# Patient Record
Sex: Male | Born: 1990 | Race: White | Hispanic: No | Marital: Single | Smoking: Current every day smoker
Health system: Southern US, Community
[De-identification: ages and names within clinical notes are randomized; demographics above are authoritative.]

## PROBLEM LIST (undated history)

## (undated) HISTORY — PX: SHOULDER SURGERY: SHX246

---
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
## 2012-05-07 ENCOUNTER — Encounter (HOSPITAL_COMMUNITY): Payer: Self-pay

## 2012-05-07 ENCOUNTER — Emergency Department (HOSPITAL_COMMUNITY)
Admission: EM | Admit: 2012-05-07 | Discharge: 2012-05-07 | Disposition: A | Discharge status: Home / Self Care | Payer: Self-pay | Attending: Emergency Medicine | Admitting: Emergency Medicine

## 2012-05-07 DIAGNOSIS — F410 Panic disorder [episodic paroxysmal anxiety] without agoraphobia: Secondary | ICD-10-CM

## 2012-05-07 DIAGNOSIS — F17200 Nicotine dependence, unspecified, uncomplicated: Secondary | ICD-10-CM | POA: Insufficient documentation

## 2012-05-07 DIAGNOSIS — R0602 Shortness of breath: Secondary | ICD-10-CM | POA: Insufficient documentation

## 2012-05-07 DIAGNOSIS — F1010 Alcohol abuse, uncomplicated: Secondary | ICD-10-CM

## 2012-05-07 NOTE — Discharge Instructions (Signed)
The most likely cause of your shortness of breath is anxiety. Your oxygen level here, even while walking is 100%. We cannot improve upon that. If you continue to have shortness of breath, develop a cough, fever, vomiting, return to the ER for evaluation.

## 2012-05-07 NOTE — ED Notes (Signed)
Pt walked around department several times, o2 sats remained between 99-100%, MD notified.

## 2012-05-07 NOTE — ED Notes (Signed)
Pt via EMS after being found sitting in vehicle by police. Pt feels good at present. No signs of distress. Pt states he drank 12 beers

## 2012-05-09 NOTE — ED Provider Notes (Signed)
History     CSN: [id number]  Arrival date & time 05/07/12  0608   First MD Initiated Contact with Patient 05/07/12 [phone]      Chief Complaint  Patient presents with  . Alcohol Problem    (Consider location/radiation/quality/duration/timing/severity/associated sxs/prior treatment) HPI James Mcmahon is a 21 y.o. male brought in by ambulance, who presents to the Emergency Department having been found by police sitting in his car. He had a fight with his girlfriend and drank 12 beers. He did not feel he was safe to drive home so he pulled over on the side of the road. He denies SI/HI. He reports he was feeling anxious and felt short of breath. Currently he has no c/o.    History reviewed. No pertinent past medical history.  History reviewed. No pertinent past surgical history.  No family history on file.  History  Substance Use Topics  . Smoking status: Current Some Day Smoker  . Smokeless tobacco: Not on file  . Alcohol Use: 7.2 oz/week    12 Cans of beer per week      Review of Systems  Constitutional: Negative for fever.       10 Systems reviewed and are negative for acute change except as noted in the HPI.  HENT: Negative for congestion.   Eyes: Negative for discharge and redness.  Respiratory: Negative for cough and shortness of breath.   Cardiovascular: Negative for chest pain.  Gastrointestinal: Negative for vomiting and abdominal pain.  Musculoskeletal: Negative for back pain.  Skin: Negative for rash.  Neurological: Negative for syncope, numbness and headaches.  Psychiatric/Behavioral:       No behavior change.    Allergies  Review of patient's allergies indicates no known allergies.  Home Medications  No current outpatient prescriptions on file.  BP 126/80  Pulse 85  Temp(Src) 98.7 F (37.1 C) (Oral)  Resp 20  Ht 5\' 6"  (1.676 m)  Wt 140 lb (63.504 kg)  BMI 22.60 kg/m2  SpO2 99%  Physical Exam  Nursing note and vitals  reviewed. Constitutional:       Awake, alert, nontoxic appearance.  HENT:  Head: Atraumatic.  Eyes: Right eye exhibits no discharge. Left eye exhibits no discharge.  Neck: Neck supple.  Cardiovascular: Normal rate, regular rhythm and normal heart sounds.   Pulmonary/Chest: Effort normal. He exhibits no tenderness.  Abdominal: Soft. There is no tenderness. There is no rebound.  Musculoskeletal: He exhibits no tenderness.       Baseline ROM, no obvious new focal weakness.  Neurological:       Mental status and motor strength appears baseline for patient and situation.  Skin: No rash noted.  Psychiatric: He has a normal mood and affect.    ED Course  Procedures (including critical care time)    1. Shortness of breath       MDM  Patient brought in by ambulance after drinking 12 beers. He has remained alert and oriented, ambulated in the hall without assistance. He has a family member to drive him home.Pt stable in ED with no significant deterioration in condition.The patient appears reasonably screened and/or stabilized for discharge and I doubt any other medical condition or other [facility] requiring further screening, evaluation, or treatment in the ED at this time prior to discharge.  MDM Reviewed: nursing note and vitals           Nicoletta Dress. Colon Branch, MD 05/09/12 2157

## 2014-08-25 ENCOUNTER — Telehealth: Payer: Self-pay | Admitting: Family Medicine

## 2014-08-25 ENCOUNTER — Ambulatory Visit (INDEPENDENT_AMBULATORY_CARE_PROVIDER_SITE_OTHER): Payer: Managed Care, Other (non HMO) | Admitting: Family Medicine

## 2014-08-25 ENCOUNTER — Encounter: Payer: Self-pay | Admitting: Family Medicine

## 2014-08-25 VITALS — BP 140/86 | HR 103 | Temp 99.4°F | Ht 66.0 in | Wt 160.0 lb

## 2014-08-25 DIAGNOSIS — J019 Acute sinusitis, unspecified: Secondary | ICD-10-CM | POA: Diagnosis not present

## 2014-08-25 MED ORDER — AMOXICILLIN 500 MG PO CAPS: 500.0000 mg | ORAL_CAPSULE | Freq: Three times a day (TID) | ORAL | Status: DC

## 2014-08-25 MED ORDER — HYDROCODONE-HOMATROPINE 5-1.5 MG/5ML PO SYRP
5.0000 mL | ORAL_SOLUTION | Freq: Three times a day (TID) | ORAL | Status: DC | PRN
Start: 2014-08-25 — End: 2014-12-24

## 2014-08-25 NOTE — Progress Notes (Signed)
Patient ID: James Mcmahon, male   DOB: Jul 14, 1991, 23 y.o.   MRN: [id number] S: 23 year old gentleman with a two-day history of sore throat cough and sinus pain and pressure. He is not sure whether or not he has had fever. The cough is described as occasionally productive. He says that he was exposed to mono to his girlfriend's younger sister but since he has only had sore throat for 2 days I would not check him for that.  O: Throat mildly injected without exudate; there is no significant adenopathy although he says his neck is tender. Sinus tenderness in the frontal area Ears TMs are clear with normal landmarks Chest lungs are clear to percussion and auscultation I suspect this is related to sinus infection and drainage since he has the cough. I doubt mono and have discussed this with him as well  AP: Probable sinus infection plan Will treat with amoxicillin 500 3 times a day and Hycodan cough syrup 1 or 2 teaspoons at bedtime. If symptoms persist consider checking for mono  Frederica Kuster MD

## 2014-08-25 NOTE — Patient Instructions (Signed)

## 2014-08-27 NOTE — Telephone Encounter (Signed)
Attempt to contact patient was unsuccessful.  

## 2014-12-24 ENCOUNTER — Ambulatory Visit (INDEPENDENT_AMBULATORY_CARE_PROVIDER_SITE_OTHER): Payer: BLUE CROSS/BLUE SHIELD | Admitting: Family Medicine

## 2014-12-24 ENCOUNTER — Encounter: Payer: Self-pay | Admitting: Family Medicine

## 2014-12-24 VITALS — BP 123/81 | HR 65 | Temp 97.9°F | Ht 66.0 in | Wt 164.8 lb

## 2014-12-24 DIAGNOSIS — J206 Acute bronchitis due to rhinovirus: Secondary | ICD-10-CM

## 2014-12-24 MED ORDER — AMOXICILLIN 500 MG PO CAPS: 500.0000 mg | ORAL_CAPSULE | Freq: Three times a day (TID) | ORAL | Status: AC

## 2014-12-24 MED ORDER — HYDROCODONE-HOMATROPINE 5-1.5 MG/5ML PO SYRP: 5.0000 mL | ORAL_SOLUTION | Freq: Three times a day (TID) | ORAL | Status: AC | PRN

## 2014-12-24 MED ORDER — PREDNISONE 20 MG PO TABS: 20.0000 mg | ORAL_TABLET | Freq: Every day | ORAL | Status: AC

## 2014-12-24 NOTE — Progress Notes (Signed)
   Subjective:    Patient ID: Newman NipRobert Christley, male    DOB: 03/15/1991, 24 y.o.   MRN: [id number]  HPI C/o congestion and uri sx's.  He is a smoker.  He has severe cough at night and has difficulty sleeping.  Review of Systems  Constitutional: Negative for fever.  HENT: Negative for ear pain.   Eyes: Negative for discharge.  Respiratory: Negative for cough.   Cardiovascular: Negative for chest pain.  Gastrointestinal: Negative for abdominal distention.  Endocrine: Negative for polyuria.  Genitourinary: Negative for difficulty urinating.  Musculoskeletal: Negative for gait problem and neck pain.  Skin: Negative for color change and rash.  Neurological: Negative for speech difficulty and headaches.  Psychiatric/Behavioral: Negative for agitation.       Objective:    BP 123/81 mmHg  Pulse 65  Temp(Src) 97.9 F (36.6 C) (Oral)  Ht 5\' 6"  (1.676 m)  Wt 164 lb 12.8 oz (74.753 kg)  BMI 26.61 kg/m2 Physical Exam  Constitutional: He is oriented to person, place, and time. He appears well-developed and well-nourished.  HENT:  Head: Normocephalic and atraumatic.  Mouth/Throat: Oropharynx is clear and moist.  Eyes: Pupils are equal, round, and reactive to light.  Neck: Normal range of motion. Neck supple.  Cardiovascular: Normal rate and regular rhythm.   No murmur heard. Pulmonary/Chest: Effort normal and breath sounds normal.  Abdominal: Soft. Bowel sounds are normal. There is no tenderness.  Neurological: He is alert and oriented to person, place, and time.  Skin: Skin is warm and dry.  Psychiatric: He has a normal mood and affect.          Assessment & Plan:     ICD-9-CM ICD-10-CM   1. Acute bronchitis due to Rhinovirus 466.0 J20.6    079.3     Hycodan cough syrup one tsp po q 12 hours prn #120 ml Zpak as directed Prednisone 20mg  one po qd x 4 days #4  Work note Push po fluids, rest, tylenol and motrin otc prn as directed for fever, arthralgias, and myalgias.   Follow up prn if sx's continue or persist. No Follow-up on file.  Deatra CanterWilliam J Shalea Tomczak FNP

## 2015-01-05 ENCOUNTER — Encounter: Payer: Self-pay | Admitting: *Deleted

## 2015-03-30 ENCOUNTER — Encounter (HOSPITAL_COMMUNITY): Payer: Self-pay | Admitting: *Deleted

## 2015-03-30 ENCOUNTER — Emergency Department (HOSPITAL_COMMUNITY)
Admission: EM | Admit: 2015-03-30 | Discharge: 2015-03-30 | Disposition: A | Discharge status: Home / Self Care | Payer: BLUE CROSS/BLUE SHIELD | Attending: Emergency Medicine | Admitting: Emergency Medicine

## 2015-03-30 ENCOUNTER — Emergency Department (HOSPITAL_COMMUNITY): Payer: BLUE CROSS/BLUE SHIELD

## 2015-03-30 DIAGNOSIS — Y998 Other external cause status: Secondary | ICD-10-CM | POA: Diagnosis not present

## 2015-03-30 DIAGNOSIS — S46912A Strain of unspecified muscle, fascia and tendon at shoulder and upper arm level, left arm, initial encounter: Secondary | ICD-10-CM | POA: Insufficient documentation

## 2015-03-30 DIAGNOSIS — Y92814 Boat as the place of occurrence of the external cause: Secondary | ICD-10-CM | POA: Diagnosis not present

## 2015-03-30 DIAGNOSIS — Z7952 Long term (current) use of systemic steroids: Secondary | ICD-10-CM | POA: Insufficient documentation

## 2015-03-30 DIAGNOSIS — W231XXA Caught, crushed, jammed, or pinched between stationary objects, initial encounter: Secondary | ICD-10-CM | POA: Insufficient documentation

## 2015-03-30 DIAGNOSIS — S4992XA Unspecified injury of left shoulder and upper arm, initial encounter: Secondary | ICD-10-CM | POA: Diagnosis present

## 2015-03-30 DIAGNOSIS — Z720 Tobacco use: Secondary | ICD-10-CM | POA: Diagnosis not present

## 2015-03-30 DIAGNOSIS — Z792 Long term (current) use of antibiotics: Secondary | ICD-10-CM | POA: Insufficient documentation

## 2015-03-30 DIAGNOSIS — Y9389 Activity, other specified: Secondary | ICD-10-CM | POA: Diagnosis not present

## 2015-03-30 MED ORDER — NAPROXEN 500 MG PO TABS
500.0000 mg | ORAL_TABLET | Freq: Once | ORAL | Status: AC
  Administered 2015-03-30: 500 mg via ORAL
  Filled 2015-03-30: qty 1

## 2015-03-30 MED ORDER — NAPROXEN 500 MG PO TABS: 500.0000 mg | ORAL_TABLET | Freq: Two times a day (BID) | ORAL | Status: AC

## 2015-03-30 MED ORDER — HYDROCODONE-ACETAMINOPHEN 5-325 MG PO TABS
1.0000 | ORAL_TABLET | Freq: Once | ORAL | Status: AC
  Administered 2015-03-30: 1 via ORAL
  Filled 2015-03-30: qty 1

## 2015-03-30 NOTE — ED Provider Notes (Signed)
CSN: [id number]     Arrival date & time 03/30/15  0941 History   First MD Initiated Contact with Patient 03/30/15 1010     Chief Complaint  Patient presents with  . Shoulder Pain     (Consider location/radiation/quality/duration/timing/severity/associated sxs/prior Treatment) Patient is a 24 y.o. male presenting with shoulder pain. The history is provided by the patient. No language interpreter was used.  Shoulder Pain Location:  Shoulder Time since incident:  1 hour Injury: yes   Mechanism of injury: amputation   Mechanism of injury comment:  Cut arm caught in a bag strap Pain details:    Quality:  Aching   Radiates to:  Does not radiate   Severity:  Moderate   Onset quality:  Sudden   Duration:  1 hour   Timing:  Constant   Progression:  Unchanged Chronicity:  New Dislocation: no   Foreign body present:  No foreign bodies Tetanus status:  Up to date Prior injury to area:  Yes Relieved by:  Immobilization Worsened by:  Movement and stretching area Ineffective treatments:  None tried Associated symptoms: numbness   Associated symptoms: no back pain, no fatigue, no fever, no neck pain, no stiffness, no swelling and no tingling   Associated symptoms comment:  Paresthesias of fingertips of left hand Risk factors: concern for non-accidental trauma   Risk factors: no known bone disorder     No past medical history on file. No past surgical history on file. Family History  Problem Relation Age of Onset  . ADD / ADHD Brother   . ADD / ADHD Brother    History  Substance Use Topics  . Smoking status: Current Every Day Smoker -- 0.50 packs/day for 7 years  . Smokeless tobacco: Never Used  . Alcohol Use: 7.2 oz/week    12 Cans of beer per week     Comment: weekly    Review of Systems  Constitutional: Negative for fever, activity change, appetite change and fatigue.  HENT: Negative for congestion, facial swelling, rhinorrhea and trouble swallowing.   Eyes: Negative for  photophobia and pain.  Respiratory: Negative for cough, chest tightness and shortness of breath.   Cardiovascular: Negative for chest pain and leg swelling.  Gastrointestinal: Negative for nausea, vomiting, abdominal pain, diarrhea and constipation.  Endocrine: Negative for polydipsia and polyuria.  Genitourinary: Negative for dysuria, urgency, decreased urine volume and difficulty urinating.  Musculoskeletal: Negative for back pain, gait problem, stiffness and neck pain.  Skin: Negative for color change, rash and wound.  Allergic/Immunologic: Negative for immunocompromised state.  Neurological: Negative for dizziness, facial asymmetry, speech difficulty, weakness, numbness and headaches.  Psychiatric/Behavioral: Negative for confusion, decreased concentration and agitation.      Allergies  Review of patient's allergies indicates no known allergies.  Home Medications   Prior to Admission medications   Medication Sig Start Date End Date Taking? Authorizing Provider  ibuprofen (ADVIL,MOTRIN) 200 MG tablet Take 400 mg by mouth every 4 (four) hours as needed for fever, headache, moderate pain or cramping.   Yes Historical Provider, MD  amoxicillin (AMOXIL) 500 MG capsule Take 1 capsule (500 mg total) by mouth 3 (three) times daily. Patient not taking: Reported on 03/30/2015 12/24/14   Deatra Canter, FNP  HYDROcodone-homatropine [facility]) 5-1.5 MG/5ML syrup Take 5 mLs by mouth every 8 (eight) hours as needed for cough. Patient not taking: Reported on 03/30/2015 12/24/14   Deatra Canter, FNP  naproxen (NAPROSYN) 500 MG tablet Take 1 tablet (500 mg total)  by mouth 2 (two) times daily with a meal. 03/30/15   Toy CookeyMegan Aava Deland, MD  predniSONE (DELTASONE) 20 MG tablet Take 1 tablet (20 mg total) by mouth daily with breakfast. Patient not taking: Reported on 03/30/2015 12/24/14   Anselm PancoastWilliam J Oxford, FNP   BP 144/83 mmHg  Pulse 72  Resp 17  Ht 5\' 7"  (1.702 m)  Wt 165 lb (74.844 kg)  BMI 25.84 kg/m2   SpO2 100% Physical Exam  Constitutional: He is oriented to person, place, and time. He appears well-developed and well-nourished. No distress.  HENT:  Head: Normocephalic and atraumatic.  Mouth/Throat: No oropharyngeal exudate.  Eyes: Pupils are equal, round, and reactive to light.  Neck: Normal range of motion. Neck supple.  Cardiovascular: Normal rate, regular rhythm and normal heart sounds.  Exam reveals no gallop and no friction rub.   No murmur heard. Pulmonary/Chest: Effort normal and breath sounds normal. No respiratory distress. He has no wheezes. He has no rales.  Abdominal: Soft. Bowel sounds are normal. He exhibits no distension and no mass. There is no tenderness. There is no rebound and no guarding.  Musculoskeletal: He exhibits no edema.       Left shoulder: He exhibits decreased range of motion, tenderness and pain. He exhibits no swelling, no effusion, no crepitus, no deformity, no laceration, normal pulse and normal strength.       Arms: Neurological: He is alert and oriented to person, place, and time.  Skin: Skin is warm and dry.  Psychiatric: He has a normal mood and affect.    ED Course  Procedures (including critical care time) Labs Review Labs Reviewed - No data to display  Imaging Review Dg Shoulder Left  03/30/2015   CLINICAL DATA:  Injured LEFT shoulder today when bulk bag got caught on boat jerking shoulder, shoulder pain, prior LEFT shoulder dislocation 3 times  EXAM: LEFT SHOULDER - 2+ VIEW  COMPARISON:  None  FINDINGS: AC joint alignment normal.  Osseous mineralization normal.  No acute fracture, dislocation or bone destruction.  Visualized LEFT ribs intact.  IMPRESSION: Normal exam.   Electronically Signed   By: Ulyses SouthwardMark  Boles M.D.   On: 03/30/2015 10:40     EKG Interpretation None      MDM   Final diagnoses:  Left shoulder strain, initial encounter   Pt is a [age] y.o. male with Pmhx as above who presents with sudden onset left anterior shoulder  pain after getting his arm caught in a fishing bag.  He reports having some paresthesias in her fingertips of left hand, otherwise has no neuro complaints, motor and pulses are normal.  Shoulder appears located.  X-ray negative.  Patient will be placed in a sling for comfort, plan is for scheduled NSAIDs, ice and heat for pain.  I've asked him to follow-up with orthopedics in one week if still having symptoms to work on range of motion at home to avoid stiff shoulder.       Toy CookeyMegan Auguste Tebbetts, MD 03/30/15 [phone]

## 2015-03-30 NOTE — ED Notes (Signed)
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 Today pt was getting off his boat with book bag on, when left strap of book bag got caught and pt got jerked and got hung on boat by left strap of book bag. Pt reports now having left sided shoulder pain 7/10. Slight numbness in left finger tips. Radial pulse strong. Pt to intense to straighten arm out, limited ROM. Pt also while in [employer] had been told he had left rotator cuff damage.

## 2015-03-30 NOTE — Discharge Instructions (Signed)
Shoulder Pain The shoulder is the joint that connects your arms to your body. The bones that form the shoulder joint include the upper arm bone (humerus), the shoulder blade (scapula), and the collarbone (clavicle). The top of the humerus is shaped like a ball and fits into a rather flat socket on the scapula (glenoid cavity). A combination of muscles and strong, fibrous tissues that connect muscles to bones (tendons) support your shoulder joint and hold the ball in the socket. Small, fluid-filled sacs (bursae) are located in different areas of the joint. They act as cushions between the bones and the overlying soft tissues and help reduce friction between the gliding tendons and the bone as you move your arm. Your shoulder joint allows a wide range of motion in your arm. This range of motion allows you to do things like scratch your back or throw a ball. However, this range of motion also makes your shoulder more prone to pain from overuse and injury. Causes of shoulder pain can originate from both injury and overuse and usually can be grouped in the following four categories:  Redness, swelling, and pain (inflammation) of the tendon (tendinitis) or the bursae (bursitis).  Instability, such as a dislocation of the joint.  Inflammation of the joint (arthritis).  Broken bone (fracture). HOME CARE INSTRUCTIONS   Apply ice to the sore area.  Put ice in a plastic bag.  Place a towel between your skin and the bag.  Leave the ice on for 15-20 minutes, 3-4 times per day for the first 2 days, or as directed by your health care provider.  Stop using cold packs if they do not help with the pain.  If you have a shoulder sling or immobilizer, wear it as long as your caregiver instructs. Only remove it to shower or bathe. Move your arm as little as possible, but keep your hand moving to prevent swelling.  Squeeze a soft ball or foam pad as much as possible to help prevent swelling.  Only take  over-the-counter or prescription medicines for pain, discomfort, or fever as directed by your caregiver. SEEK MEDICAL CARE IF:   Your shoulder pain increases, or new pain develops in your arm, hand, or fingers.  Your hand or fingers become cold and numb.  Your pain is not relieved with medicines. SEEK IMMEDIATE MEDICAL CARE IF:   Your arm, hand, or fingers are numb or tingling.  Your arm, hand, or fingers are significantly swollen or turn white or blue. MAKE SURE YOU:   Understand these instructions.  Will watch your condition.  Will get help right away if you are not doing well or get worse. Document Released: 09/06/2005 Document Revised: 04/13/2014 Document Reviewed: 11/11/2011 [facility] Patient Information 2015 [place], [place]. This information is not intended to replace advice given to you by your health care provider. Make sure you discuss any questions you have with your health care provider.   Arm Sling Use A sling is used to:  Limit how much your arm moves.  Make you more comfortable.  Support your arm. The sling fits well if:  Your elbow rests in the bottom and corner pocket.  Only your fingers show at the opening. Your wrist should fit inside and be supported by the sling.  The strap goes around your shoulder or neck for support.  Your arm is fairly level with your hand, slightly higher than your elbow. HOME CARE   Adjust the sling to keep the hand inside. Slings tend to  slip, making the elbow point up. Tug the elbow back into place.  The fingers should feel warm and be a normal color.  Try to keep the palm of the hand toward the body while wearing the sling.  Take the sling off when going to sleep if this is okay with your doctor.  Use an extra pillow at night to protect the arm. Slide the arm between a pillow and the cover.  Take baths or showers as told by your doctor.  Only take medicine as told by your doctor. GET HELP RIGHT AWAY IF:   The  fingers turn cold or start to tingle.  The arm pain gets worse.  The pain is not helped by medicine or by adjusting the sling. MAKE SURE YOU:   Understand these instructions.  Will watch this condition.  Will get help right away if you are not doing well or get worse. Document Released: 05/15/2008 Document Revised: 02/19/2012 Document Reviewed: 05/15/2008 [facility] Patient Information 2015 [place]. This information is not intended to replace advice given to you by your health care provider. Make sure you discuss any questions you have with your health care provider.

## 2015-11-11 ENCOUNTER — Encounter (HOSPITAL_BASED_OUTPATIENT_CLINIC_OR_DEPARTMENT_OTHER): Payer: Self-pay | Admitting: *Deleted

## 2015-11-11 ENCOUNTER — Emergency Department (HOSPITAL_BASED_OUTPATIENT_CLINIC_OR_DEPARTMENT_OTHER)
Admission: EM | Admit: 2015-11-11 | Discharge: 2015-11-11 | Disposition: A | Discharge status: Home / Self Care | Payer: BLUE CROSS/BLUE SHIELD | Attending: Emergency Medicine | Admitting: Emergency Medicine

## 2015-11-11 DIAGNOSIS — Z791 Long term (current) use of non-steroidal anti-inflammatories (NSAID): Secondary | ICD-10-CM | POA: Insufficient documentation

## 2015-11-11 DIAGNOSIS — K029 Dental caries, unspecified: Secondary | ICD-10-CM | POA: Insufficient documentation

## 2015-11-11 MED ORDER — BUPIVACAINE-EPINEPHRINE (PF) 0.5% -1:200000 IJ SOLN
INTRAMUSCULAR | Status: AC
  Filled 2015-11-11: qty 1.8

## 2015-11-11 MED ORDER — HYDROCODONE-ACETAMINOPHEN 5-325 MG PO TABS: 1.0000 | ORAL_TABLET | Freq: Four times a day (QID) | ORAL | Status: AC | PRN

## 2015-11-11 MED ORDER — PENICILLIN V POTASSIUM 250 MG PO TABS: 250.0000 mg | ORAL_TABLET | Freq: Four times a day (QID) | ORAL | Status: AC

## 2015-11-11 NOTE — ED Notes (Signed)
States he had a tooth break yesterday. He cannot get a dentist appointment until Monday.

## 2015-11-11 NOTE — ED Provider Notes (Addendum)
CSN: [id number]     Arrival date & time 11/11/15  1435 History   First MD Initiated Contact with Patient 11/11/15 1445     Chief Complaint  Patient presents with  . Dental Pain     (Consider location/radiation/quality/duration/timing/severity/associated sxs/prior Treatment) Patient is a 24 y.o. male presenting with tooth pain. The history is provided by the patient.  Dental Pain Location:  Upper Upper teeth location:  5/RU 1st bicuspid Quality:  Localized, pulsating, radiating and sharp Severity:  Severe Onset quality:  Gradual Duration:  2 days Timing:  Constant Progression:  Worsening Chronicity:  New Context: dental caries   Context comment:  Tooth broke off yesterday while eating steak and has been hurting since Relieved by:  Nothing Worsened by:  Cold food/drink and hot food/drink Ineffective treatments:  None tried Associated symptoms: no drooling, no facial pain, no facial swelling, no oral bleeding and no trismus   Risk factors: lack of dental care, periodontal disease and smoking     History reviewed. No pertinent past medical history. Past Surgical History  Procedure Laterality Date  . Shoulder surgery     Family History  Problem Relation Age of Onset  . ADD / ADHD Brother   . ADD / ADHD Brother    Social History  Substance Use Topics  . Smoking status: Current Every Day Smoker -- 0.50 packs/day for 7 years  . Smokeless tobacco: Never Used  . Alcohol Use: 7.2 oz/week    12 Cans of beer per week     Comment: weekly    Review of Systems  HENT: Negative for drooling and facial swelling.   All other systems reviewed and are negative.     Allergies  Review of patient's allergies indicates no known allergies.  Home Medications   Prior to Admission medications   Medication Sig Start Date End Date Taking? Authorizing Provider  amoxicillin (AMOXIL) 500 MG capsule Take 1 capsule (500 mg total) by mouth 3 (three) times daily. Patient not taking: Reported  on 03/30/2015 12/24/14   Deatra CanterWilliam J Oxford, FNP  HYDROcodone-homatropine [facility](HYCODAN) 5-1.5 MG/5ML syrup Take 5 mLs by mouth every 8 (eight) hours as needed for cough. Patient not taking: Reported on 03/30/2015 12/24/14   Deatra CanterWilliam J Oxford, FNP  ibuprofen (ADVIL,MOTRIN) 200 MG tablet Take 400 mg by mouth every 4 (four) hours as needed for fever, headache, moderate pain or cramping.    Historical Provider, MD  naproxen (NAPROSYN) 500 MG tablet Take 1 tablet (500 mg total) by mouth 2 (two) times daily with a meal. 03/30/15   Toy CookeyMegan Docherty, MD  predniSONE (DELTASONE) 20 MG tablet Take 1 tablet (20 mg total) by mouth daily with breakfast. Patient not taking: Reported on 03/30/2015 12/24/14   Anselm PancoastWilliam J Oxford, FNP   BP 137/77 mmHg  Pulse 72  Temp(Src) 98.2 F (36.8 C) (Oral)  Resp 16  Ht 5\' 8"  (1.727 m)  Wt 160 lb (72.576 kg)  BMI 24.33 kg/m2  SpO2 99% Physical Exam  Constitutional: He is oriented to person, place, and time. He appears well-developed and well-nourished. No distress.  HENT:  Head: Normocephalic and atraumatic.  Mouth/Throat: Oropharynx is clear and moist. No trismus in the jaw. No uvula swelling.    Eyes: Conjunctivae and EOM are normal. Pupils are equal, round, and reactive to light.  Neck: Normal range of motion. Neck supple.  Cardiovascular: Normal rate, regular rhythm and intact distal pulses.   No murmur heard. Pulmonary/Chest: Effort normal.  Neurological: He is alert and  oriented to person, place, and time.  Skin: Skin is warm and dry. No rash noted. No erythema.  Psychiatric: He has a normal mood and affect. His behavior is normal.  Nursing note and vitals reviewed.   ED Course  .Nerve Block Date/Time: 11/11/2015 2:54 PM Performed by: Gwyneth Sprout Authorized by: Gwyneth Sprout Consent: Verbal consent obtained. Risks and benefits: risks, benefits and alternatives were discussed Consent given by: patient Patient understanding: patient states understanding of  the procedure being performed Patient identity confirmed: verbally with patient Indications: pain relief Body area: face/mouth (right apical block of tooth) Laterality: left Patient sedated: no Patient position: supine Needle gauge: 27 G Local anesthetic: bupivacaine 0.5% with epinephrine Anesthetic total: 1.8 ml Outcome: pain improved Patient tolerance: Patient tolerated the procedure well with no immediate complications   (including critical care time) Labs Review Labs Reviewed - No data to display  Imaging Review No results found. I have personally reviewed and evaluated these images and lab results as part of my medical decision-making.   EKG Interpretation None      MDM   Final diagnoses:  Dental caries    Pt with dental caries with fractured tooth and no facial swelling.  No signs of ludwig's angina or difficulty swallowing and no systemic symptoms. Will treat with PCN and have pt f/u with dentist.     Gwyneth Sprout, MD 11/11/15 [id number]  Gwyneth Sprout, MD 11/11/15 [phone]

## 2015-11-11 NOTE — Discharge Instructions (Signed)
Dental Caries °Dental caries is tooth decay. This decay can cause a hole in teeth (cavity) that can get bigger and deeper over time. °HOME CARE °· Brush and floss your teeth. Do this at least two times a day. °· Use a fluoride toothpaste. °· Use a mouth rinse if told by your dentist or doctor. °· Eat less sugary and starchy foods. Drink less sugary drinks. °· Avoid snacking often on sugary and starchy foods. Avoid sipping often on sugary drinks. °· Keep regular checkups and cleanings with your dentist. °· Use fluoride supplements if told by your dentist or doctor. °· Allow fluoride to be applied to teeth if told by your dentist or doctor. °  °This information is not intended to replace advice given to you by your health care provider. Make sure you discuss any questions you have with your health care provider. °  °Document Released: 09/05/2008 Document Revised: 12/18/2014 Document Reviewed: 11/29/2012 °Elsevier Interactive Patient Education ©2016 [employer] ° °

## 2015-11-19 ENCOUNTER — Encounter (HOSPITAL_BASED_OUTPATIENT_CLINIC_OR_DEPARTMENT_OTHER): Payer: Self-pay | Admitting: *Deleted

## 2015-11-19 ENCOUNTER — Emergency Department (HOSPITAL_BASED_OUTPATIENT_CLINIC_OR_DEPARTMENT_OTHER)
Admission: EM | Admit: 2015-11-19 | Discharge: 2015-11-20 | Disposition: A | Discharge status: Home / Self Care | Payer: Self-pay | Attending: Emergency Medicine | Admitting: Emergency Medicine

## 2015-11-19 DIAGNOSIS — Z791 Long term (current) use of non-steroidal anti-inflammatories (NSAID): Secondary | ICD-10-CM | POA: Insufficient documentation

## 2015-11-19 DIAGNOSIS — F17210 Nicotine dependence, cigarettes, uncomplicated: Secondary | ICD-10-CM | POA: Insufficient documentation

## 2015-11-19 DIAGNOSIS — R0981 Nasal congestion: Secondary | ICD-10-CM | POA: Insufficient documentation

## 2015-11-19 DIAGNOSIS — R110 Nausea: Secondary | ICD-10-CM | POA: Insufficient documentation

## 2015-11-19 DIAGNOSIS — K0889 Other specified disorders of teeth and supporting structures: Secondary | ICD-10-CM | POA: Insufficient documentation

## 2015-11-19 DIAGNOSIS — R42 Dizziness and giddiness: Secondary | ICD-10-CM | POA: Insufficient documentation

## 2015-11-19 NOTE — ED Notes (Signed)
Pt states that he has been having dizziness x 2 weeks. Reports that he had a syncopal episode this PM. Reports that when he came to he was diaphoretic and had blurred vision

## 2015-11-20 ENCOUNTER — Emergency Department (HOSPITAL_BASED_OUTPATIENT_CLINIC_OR_DEPARTMENT_OTHER): Payer: BLUE CROSS/BLUE SHIELD

## 2015-11-20 MED ORDER — MECLIZINE HCL 12.5 MG PO TABS: 12.5000 mg | ORAL_TABLET | Freq: Three times a day (TID) | ORAL | Status: AC | PRN

## 2015-11-20 MED ORDER — FLUTICASONE PROPIONATE 50 MCG/ACT NA SUSP: 2.0000 | Freq: Every day | NASAL | Status: AC

## 2015-11-20 MED ORDER — MECLIZINE HCL 25 MG PO TABS
25.0000 mg | ORAL_TABLET | Freq: Once | ORAL | Status: AC
  Administered 2015-11-20: 25 mg via ORAL
  Filled 2015-11-20: qty 1

## 2015-11-20 NOTE — Discharge Instructions (Signed)
Benign Positional Vertigo Vertigo is the feeling that you or your surroundings are moving when they are not. Benign positional vertigo is the most common form of vertigo. The cause of this condition is not serious (is benign). This condition is triggered by certain movements and positions (is positional). This condition can be dangerous if it occurs while you are doing something that could endanger you or others, such as driving.  CAUSES In many cases, the cause of this condition is not known. It may be caused by a disturbance in an area of the inner ear that helps your brain to sense movement and balance. This disturbance can be caused by a viral infection (labyrinthitis), head injury, or repetitive motion. RISK FACTORS This condition is more likely to develop in:  Women.  People who are 50 years of age or older. SYMPTOMS Symptoms of this condition usually happen when you move your head or your eyes in different directions. Symptoms may start suddenly, and they usually last for less than a minute. Symptoms may include:  Loss of balance and falling.  Feeling like you are spinning or moving.  Feeling like your surroundings are spinning or moving.  Nausea and vomiting.  Blurred vision.  Dizziness.  Involuntary eye movement (nystagmus). Symptoms can be mild and cause only slight annoyance, or they can be severe and interfere with daily life. Episodes of benign positional vertigo may return (recur) over time, and they may be triggered by certain movements. Symptoms may improve over time. DIAGNOSIS This condition is usually diagnosed by medical history and a physical exam of the head, neck, and ears. You may be referred to a health care provider who specializes in ear, nose, and throat (ENT) problems (otolaryngologist) or a provider who specializes in disorders of the nervous system (neurologist). You may have additional testing, including:  MRI.  A CT scan.  Eye movement tests. Your  health care provider may ask you to change positions quickly while he or she watches you for symptoms of benign positional vertigo, such as nystagmus. Eye movement may be tested with an electronystagmogram (ENG), caloric stimulation, the Dix-Hallpike test, or the roll test.  An electroencephalogram (EEG). This records electrical activity in your brain.  Hearing tests. TREATMENT Usually, your health care provider will treat this by moving your head in specific positions to adjust your inner ear back to normal. Surgery may be needed in severe cases, but this is rare. In some cases, benign positional vertigo may resolve on its own in 2-4 weeks. HOME CARE INSTRUCTIONS Safety  Move slowly.Avoid sudden body or head movements.  Avoid driving.  Avoid operating heavy machinery.  Avoid doing any tasks that would be dangerous to you or others if a vertigo episode would occur.  If you have trouble walking or keeping your balance, try using a cane for stability. If you feel dizzy or unstable, sit down right away.  Return to your normal activities as told by your health care provider. Ask your health care provider what activities are safe for you. General Instructions  Take over-the-counter and prescription medicines only as told by your health care provider.  Avoid certain positions or movements as told by your health care provider.  Drink enough fluid to keep your urine clear or pale yellow.  Keep all follow-up visits as told by your health care provider. This is important. SEEK MEDICAL CARE IF:  You have a fever.  Your condition gets worse or you develop new symptoms.  Your family or friends   notice any behavioral changes.  Your nausea or vomiting gets worse.  You have numbness or a "pins and needles" sensation. SEEK IMMEDIATE MEDICAL CARE IF:  You have difficulty speaking or moving.  You are always dizzy.  You faint.  You develop severe headaches.  You have weakness in your  legs or arms.  You have changes in your hearing or vision.  You develop a stiff neck.  You develop sensitivity to light.   This information is not intended to replace advice given to you by your health care provider. Make sure you discuss any questions you have with your health care provider.   Document Released: 09/04/2006 Document Revised: 08/18/2015 Document Reviewed: 03/22/2015 Elsevier Interactive Patient Education 2016 [employer] Inc[employer]  

## 2015-11-20 NOTE — ED Provider Notes (Signed)
CSN: [id number]     Arrival date & time 11/19/15  2334 History   First MD Initiated Contact with Patient 11/20/15 0025     Chief Complaint  Patient presents with  . Loss of Consciousness     (Consider location/radiation/quality/duration/timing/severity/associated sxs/prior Treatment) Patient is a 24 y.o. male presenting with dizziness.  Dizziness Quality:  Head spinning Severity:  Severe Onset quality:  Sudden Timing:  Constant Progression:  Unchanged Chronicity:  Recurrent Context: not when bending over   Relieved by:  Nothing Worsened by:  Nothing Ineffective treatments:  None tried Associated symptoms: nausea   Associated symptoms: no blood in stool, no chest pain, no headaches, no hearing loss, no palpitations, no shortness of breath, no tinnitus, no vision changes and no weakness   Risk factors: no hx of stroke and no new medications   Has had dental problems and congestion.  This has been going on for 3 weeks but did not mention it when seen 9 days ago.  Did not actually pass out but felt like he was going to  History reviewed. No pertinent past medical history. Past Surgical History  Procedure Laterality Date  . Shoulder surgery     Family History  Problem Relation Age of Onset  . ADD / ADHD Brother   . ADD / ADHD Brother    Social History  Substance Use Topics  . Smoking status: Current Every Day Smoker -- 0.50 packs/day for 7 years  . Smokeless tobacco: Never Used  . Alcohol Use: 7.2 oz/week    12 Cans of beer per week     Comment: weekly    Review of Systems  Constitutional: Negative for fever and appetite change.  HENT: Positive for congestion and dental problem. Negative for ear discharge, facial swelling, hearing loss and tinnitus.   Eyes: Negative for photophobia and visual disturbance.  Respiratory: Negative for chest tightness and shortness of breath.   Cardiovascular: Negative for chest pain, palpitations and leg swelling.  Gastrointestinal:  Positive for nausea. Negative for blood in stool.  Musculoskeletal: Negative for myalgias, gait problem, neck pain and neck stiffness.  Neurological: Positive for dizziness. Negative for tremors, seizures, facial asymmetry, speech difficulty, weakness, numbness and headaches.  All other systems reviewed and are negative.     Allergies  Review of patient's allergies indicates no known allergies.  Home Medications   Prior to Admission medications   Medication Sig Start Date End Date Taking? Authorizing Provider  amoxicillin (AMOXIL) 500 MG capsule Take 1 capsule (500 mg total) by mouth 3 (three) times daily. Patient not taking: Reported on 03/30/2015 12/24/14   Deatra CanterWilliam J Oxford, FNP  fluticasone [facility](FLONASE) 50 MCG/ACT nasal spray Place 2 sprays into both nostrils daily. 11/20/15   Falicia Lizotte, MD  HYDROcodone-acetaminophen (NORCO/VICODIN) 5-325 MG tablet Take 1-2 tablets by mouth every 6 (six) hours as needed. 11/11/15   Gwyneth SproutWhitney Plunkett, MD  HYDROcodone-homatropine (HYCODAN) 5-1.5 MG/5ML syrup Take 5 mLs by mouth every 8 (eight) hours as needed for cough. Patient not taking: Reported on 03/30/2015 12/24/14   Deatra CanterWilliam J Oxford, FNP  ibuprofen (ADVIL,MOTRIN) 200 MG tablet Take 400 mg by mouth every 4 (four) hours as needed for fever, headache, moderate pain or cramping.    Historical Provider, MD  meclizine (ANTIVERT) 12.5 MG tablet Take 1 tablet (12.5 mg total) by mouth 3 (three) times daily as needed. 11/20/15   Seren Chaloux, MD  naproxen (NAPROSYN) 500 MG tablet Take 1 tablet (500 mg total) by mouth 2 (two) times  daily with a meal. 03/30/15   Toy Cookey, MD  predniSONE (DELTASONE) 20 MG tablet Take 1 tablet (20 mg total) by mouth daily with breakfast. Patient not taking: Reported on 03/30/2015 12/24/14   Deatra Canter, FNP   BP 118/71 mmHg  Pulse 57  Temp(Src) 98 F (36.7 C) (Oral)  Resp 16  Ht  (1.727 m)  Wt 160 lb (72.576 kg)  BMI 24.33 kg/m2  SpO2 99% Physical Exam   Constitutional: He is oriented to person, place, and time. He appears well-developed and well-nourished. No distress.  HENT:  Head: Normocephalic and atraumatic.  Right Ear: External ear normal.  Left Ear: External ear normal.  Mouth/Throat: Oropharynx is clear and moist.  Eyes: Conjunctivae and EOM are normal. Pupils are equal, round, and reactive to light.  Neck: Normal range of motion. Neck supple. No tracheal deviation present.  Cardiovascular: Normal rate, regular rhythm and intact distal pulses.   Pulmonary/Chest: Effort normal and breath sounds normal. No respiratory distress. He has no wheezes. He has no rales.  Abdominal: Soft. Bowel sounds are normal. There is no tenderness. There is no rebound and no guarding.  Musculoskeletal: Normal range of motion. He exhibits no edema or tenderness.  Lymphadenopathy:    He has no cervical adenopathy.  Neurological: He is alert and oriented to person, place, and time. He has normal reflexes. He displays normal reflexes. No cranial nerve deficit. He exhibits normal muscle tone. Coordination normal.  Skin: Skin is warm and dry.  Psychiatric: He has a normal mood and affect.    ED Course  Procedures (including critical care time) Labs Review Labs Reviewed - No data to display  Imaging Review Ct Head Wo Contrast  11/20/2015  CLINICAL DATA:  Subacute onset of dizziness for 2 weeks. Syncope. Initial encounter. EXAM: CT HEAD WITHOUT CONTRAST TECHNIQUE: Contiguous axial images were obtained from the base of the skull through the vertex without intravenous contrast. COMPARISON:  None. FINDINGS: There is no evidence of acute infarction, mass lesion, or intra- or extra-axial hemorrhage on CT. The posterior fossa, including the cerebellum, brainstem and fourth ventricle, is within normal limits. The third and lateral ventricles, and basal ganglia are unremarkable in appearance. The cerebral hemispheres are symmetric in appearance, with normal  gray-white differentiation. No mass effect or midline shift is seen. There is no evidence of fracture; visualized osseous structures are unremarkable in appearance. The visualized portions of the orbits are within normal limits. The paranasal sinuses and mastoid air cells are well-aerated. No significant soft tissue abnormalities are seen. IMPRESSION: Unremarkable noncontrast CT of the head. Electronically Signed   By: Roanna Raider M.D.   On: 11/20/2015 01:17   I have personally reviewed and evaluated these images and lab results as part of my medical decision-making.   EKG Interpretation   Date/Time:  Friday November 19 2015 23:47:58 EST Ventricular Rate:  63 PR Interval:  150 QRS Duration: 92 QT Interval:  384 QTC Calculation: 392 R Axis:   69 Text Interpretation:  Confirmed by [facility]  MD, Kennidy Lamke ([id number]) on  11/20/2015 12:55:18 AM      MDM   Final diagnoses:  Vertigo    No results found for this or any previous visit. Ct Head Wo Contrast  11/20/2015  CLINICAL DATA:  Subacute onset of dizziness for 2 weeks. Syncope. Initial encounter. EXAM: CT HEAD WITHOUT CONTRAST TECHNIQUE: Contiguous axial images were obtained from the base of the skull through the vertex without intravenous contrast. COMPARISON:  None.  FINDINGS: There is no evidence of acute infarction, mass lesion, or intra- or extra-axial hemorrhage on CT. The posterior fossa, including the cerebellum, brainstem and fourth ventricle, is within normal limits. The third and lateral ventricles, and basal ganglia are unremarkable in appearance. The cerebral hemispheres are symmetric in appearance, with normal gray-white differentiation. No mass effect or midline shift is seen. There is no evidence of fracture; visualized osseous structures are unremarkable in appearance. The visualized portions of the orbits are within normal limits. The paranasal sinuses and mastoid air cells are well-aerated. No significant soft tissue  abnormalities are seen. IMPRESSION: Unremarkable noncontrast CT of the head. Electronically Signed   By: Roanna Raider M.D.   On: 11/20/2015 01:17    Orthostatic VS for the past 24 hrs:  BP- Lying Pulse- Lying BP- Sitting Pulse- Sitting BP- Standing at 0 minutes Pulse- Standing at 0 minutes  11/20/15 0026 133/68 mmHg 58 127/72 mmHg 65 130/81 mmHg 77    :      Medications  meclizine (ANTIVERT) tablet 25 mg (25 mg Oral Given 11/20/15 0048)   Symptoms consistent with BPV.  Better post meclizine negative head CT.  Will also start flonase for congestion as this is likely where symptoms are coming from.  Strict return precautions for speech changes weakness or numbness chest pain or SOB.  Patient verbalizes understanding and agrees to follow up    Matia Zelada, MD 11/20/15 [id number]

## 2016-04-14 IMAGING — CT CT HEAD W/O CM
1 series · 16 of 30 positions shown, 20 images · non-contrast
Comparison: None.

CLINICAL DATA: Subacute onset of dizziness for 2 weeks. Syncope.
Initial encounter.

EXAM:
CT HEAD WITHOUT CONTRAST
TECHNIQUE: Contiguous axial images were obtained from the base of the skull
through the vertex without intravenous contrast.

[Series 2: head 4.8 h37s · axial · 0.45mm/px · z∈[-154,-21]mm · 16 of 32 slices shown, 20 images]
[im 2/32  brain]
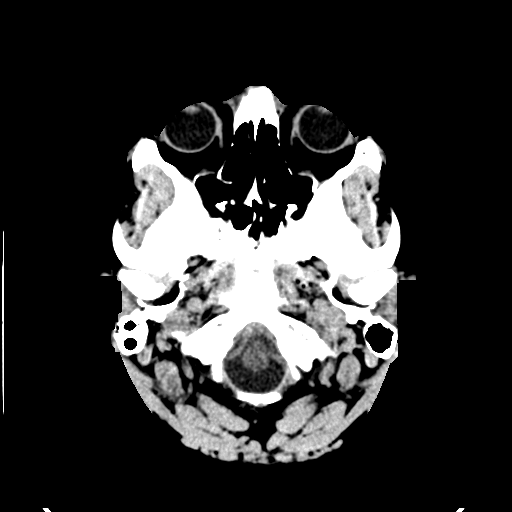
[im 2/32  bone]
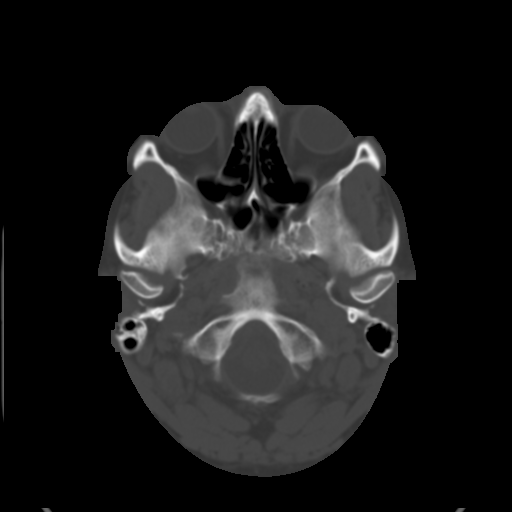
[im 4/32  brain]
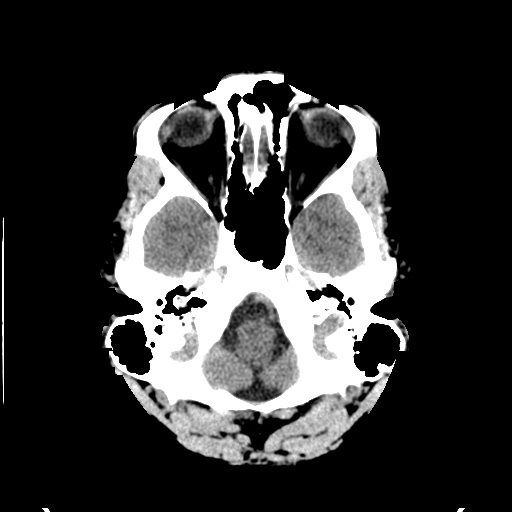
[im 6/32  brain]
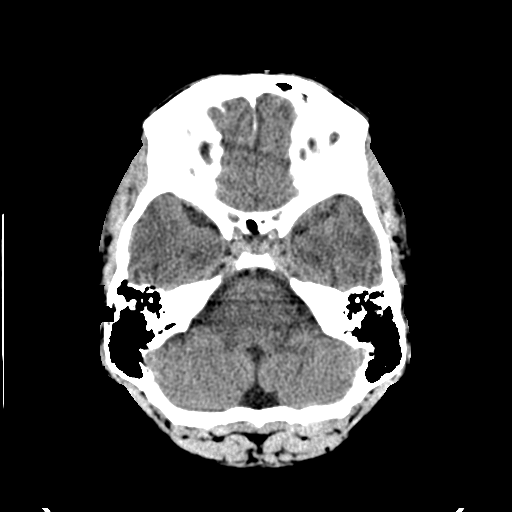
[im 8/32  brain]
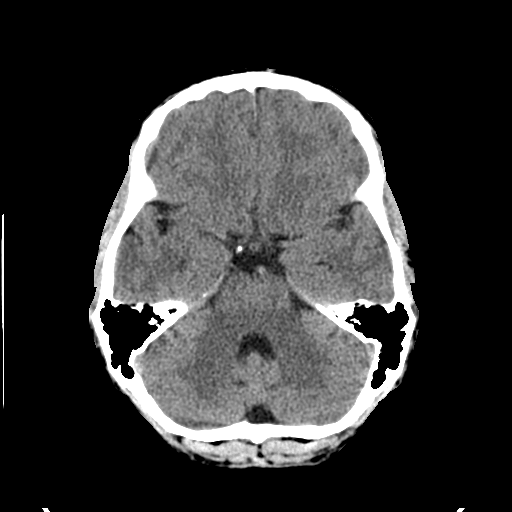
[im 9/32  brain]
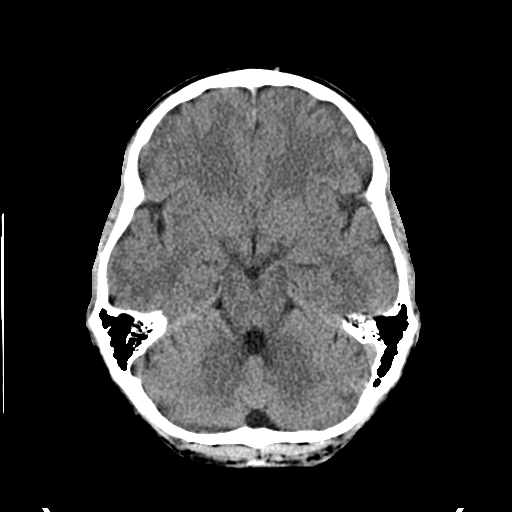
[im 9/32  bone]
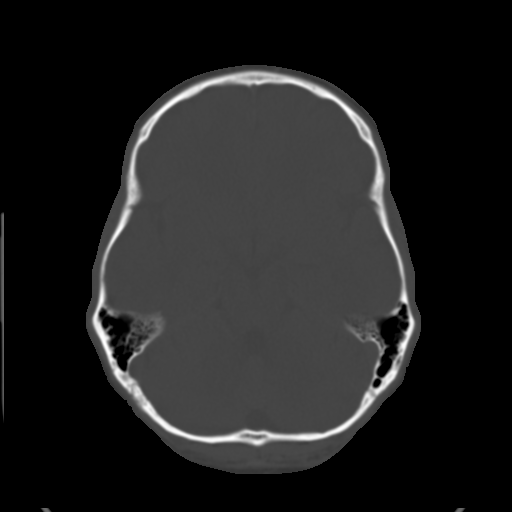
[im 11/32  brain]
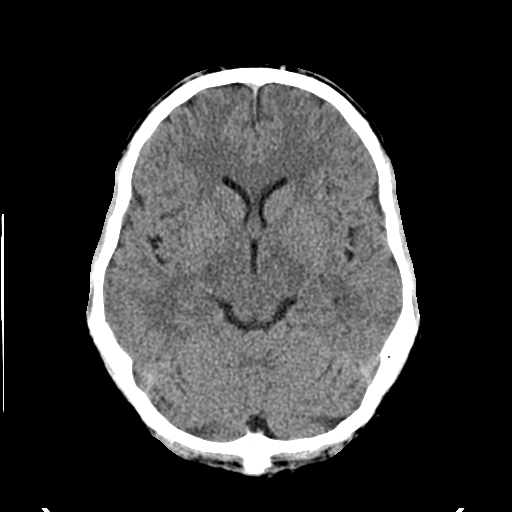
[im 13/32  brain]
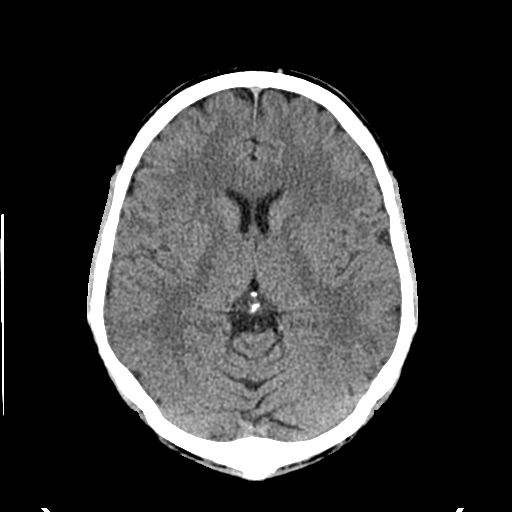
[im 15/32  brain]
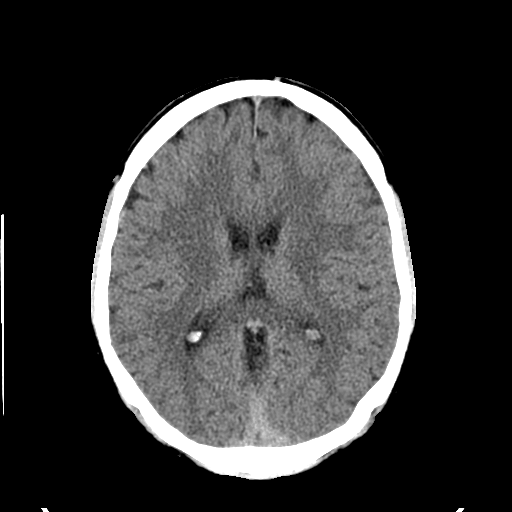
[im 17/32  brain]
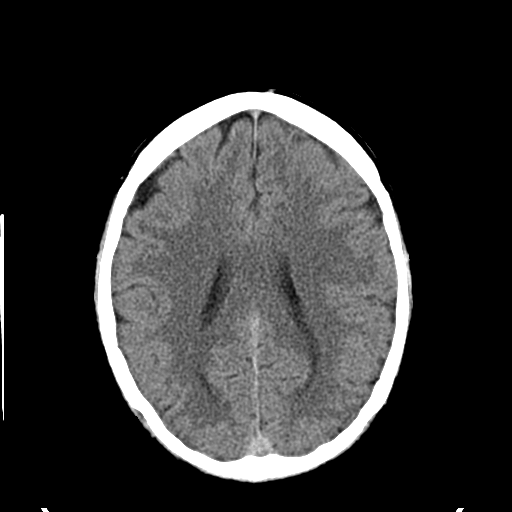
[im 17/32  bone]
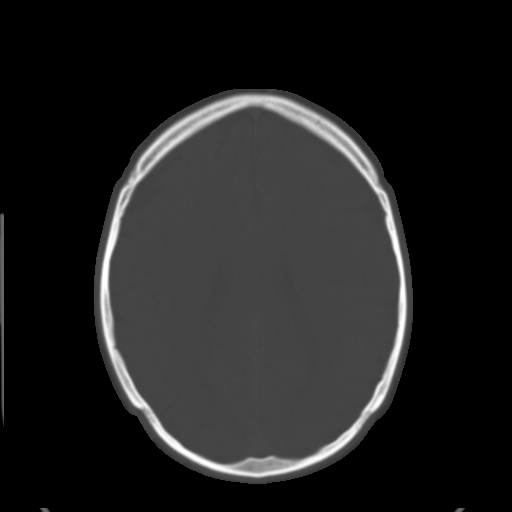
[im 19/32  brain]
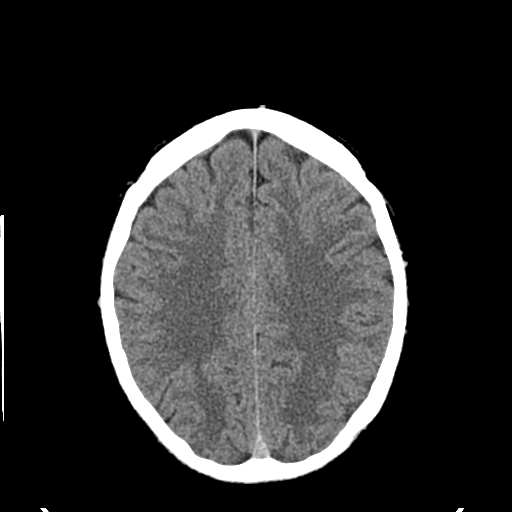
[im 21/32  brain]
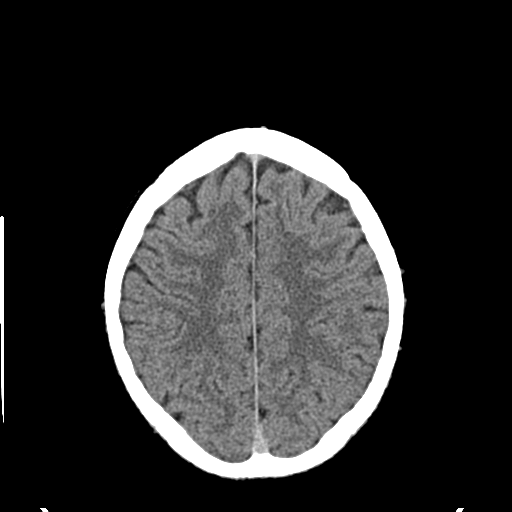
[im 23/32  brain]
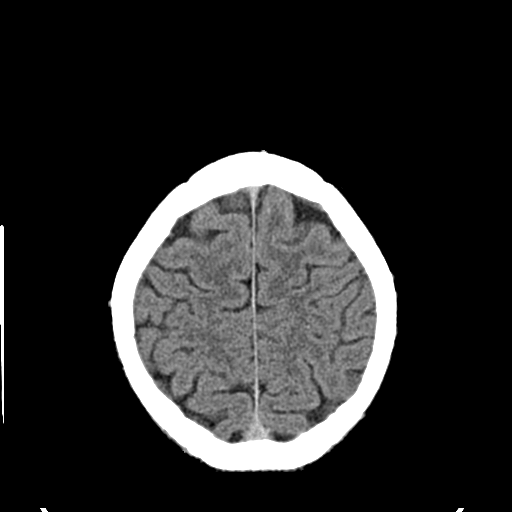
[im 24/32  brain]
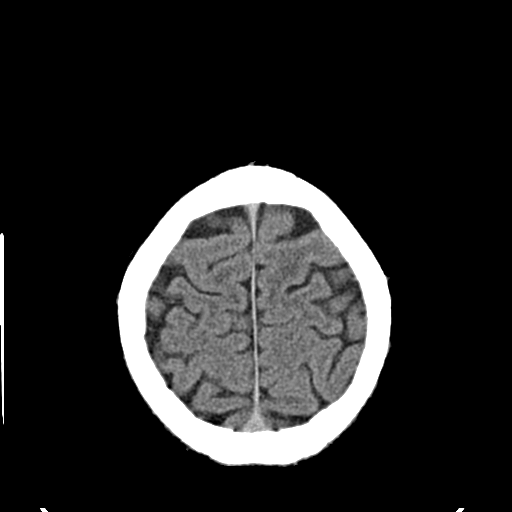
[im 24/32  bone]
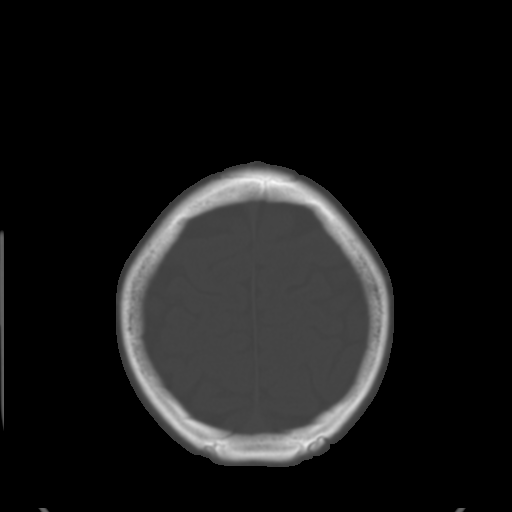
[im 26/32  brain]
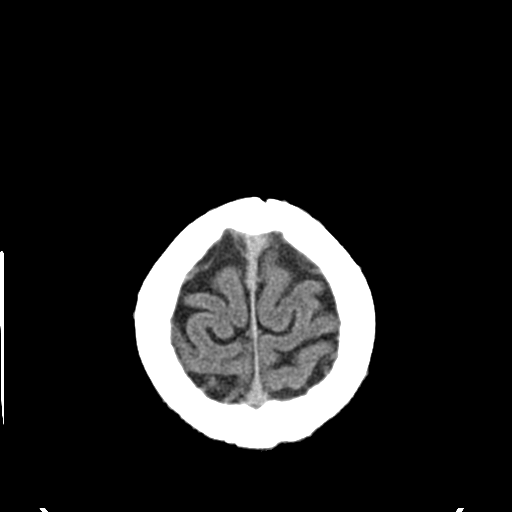
[im 28/32  brain]
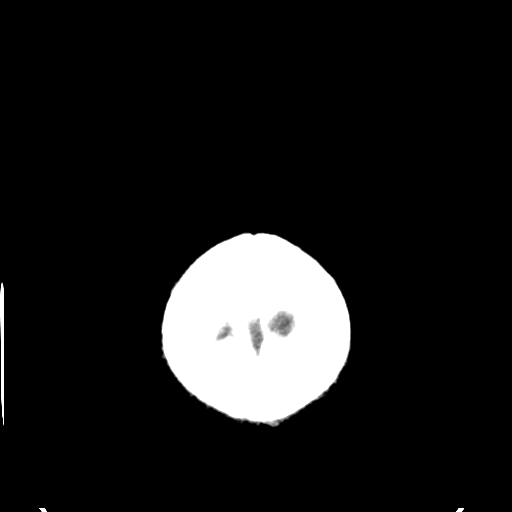
[im 30/32  brain]
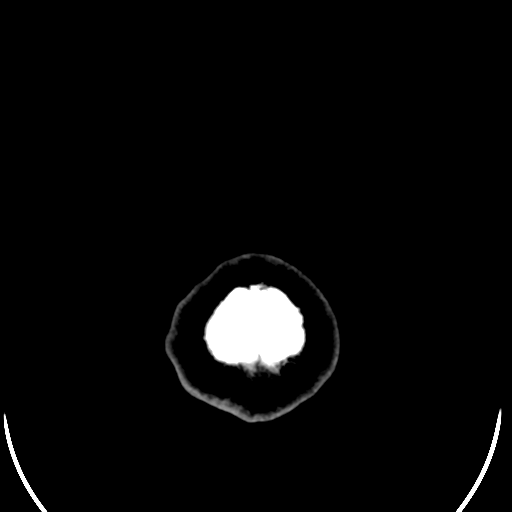

[16 of 30 positions shown; findings below may reference images not displayed]

FINDINGS: There is no evidence of acute infarction, mass lesion, or intra- or
extra-axial hemorrhage on CT.

The posterior fossa, including the cerebellum, brainstem and fourth
ventricle, is within normal limits. The third and lateral
ventricles, and basal ganglia are unremarkable in appearance. The
cerebral hemispheres are symmetric in appearance, with normal
gray-white differentiation. No mass effect or midline shift is seen.

There is no evidence of fracture; visualized osseous structures are
unremarkable in appearance. The visualized portions of the orbits
are within normal limits. The paranasal sinuses and mastoid air
cells are well-aerated. No significant soft tissue abnormalities are
seen.
IMPRESSION: Unremarkable noncontrast CT of the head.
